# Patient Record
Sex: Female | Born: 2000 | Race: Black or African American | Hispanic: No | Marital: Single | Smoking: Never smoker
Health system: Southern US, Community
[De-identification: ages and names within clinical notes are randomized; demographics above are authoritative.]

## PROBLEM LIST (undated history)

## (undated) DIAGNOSIS — F333 Major depressive disorder, recurrent, severe with psychotic symptoms: Secondary | ICD-10-CM

## (undated) DIAGNOSIS — F419 Anxiety disorder, unspecified: Secondary | ICD-10-CM

## (undated) DIAGNOSIS — F321 Major depressive disorder, single episode, moderate: Secondary | ICD-10-CM

## (undated) DIAGNOSIS — F909 Attention-deficit hyperactivity disorder, unspecified type: Secondary | ICD-10-CM

## (undated) DIAGNOSIS — F913 Oppositional defiant disorder: Secondary | ICD-10-CM

## (undated) DIAGNOSIS — Z8489 Family history of other specified conditions: Secondary | ICD-10-CM

## (undated) DIAGNOSIS — Z8659 Personal history of other mental and behavioral disorders: Secondary | ICD-10-CM

## (undated) DIAGNOSIS — E669 Obesity, unspecified: Secondary | ICD-10-CM

## (undated) DIAGNOSIS — J189 Pneumonia, unspecified organism: Secondary | ICD-10-CM

## (undated) HISTORY — PX: TYMPANOSTOMY TUBE PLACEMENT: SHX32

---
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
## 2022-01-10 ENCOUNTER — Other Ambulatory Visit: Payer: Self-pay

## 2022-01-10 ENCOUNTER — Emergency Department (HOSPITAL_COMMUNITY)
Admission: EM | Admit: 2022-01-10 | Discharge: 2022-01-10 | Disposition: A | Discharge status: Home / Self Care | Payer: Medicaid Other | Attending: Emergency Medicine | Admitting: Emergency Medicine

## 2022-01-10 ENCOUNTER — Encounter (HOSPITAL_COMMUNITY): Payer: Self-pay

## 2022-01-10 DIAGNOSIS — M791 Myalgia, unspecified site: Secondary | ICD-10-CM | POA: Diagnosis present

## 2022-01-10 DIAGNOSIS — U071 COVID-19: Secondary | ICD-10-CM | POA: Diagnosis not present

## 2022-01-10 DIAGNOSIS — J069 Acute upper respiratory infection, unspecified: Secondary | ICD-10-CM | POA: Diagnosis not present

## 2022-01-10 DIAGNOSIS — B9789 Other viral agents as the cause of diseases classified elsewhere: Secondary | ICD-10-CM | POA: Diagnosis not present

## 2022-01-10 LAB — BASIC METABOLIC PANEL
Anion gap: 9 (ref 5–15)
BUN: <5 mg/dL — ABNORMAL LOW (ref 6–20)
CO2: 21 mmol/L — ABNORMAL LOW (ref 22–32)
Calcium: 9 mg/dL (ref 8.9–10.3)
Chloride: 108 mmol/L (ref 98–111)
Creatinine, Ser: 0.66 mg/dL (ref 0.44–1.00)
GFR, Estimated: >60 mL/min (ref >60)
Glucose, Bld: 103 mg/dL — ABNORMAL HIGH (ref 70–99)
Potassium: 3.2 mmol/L — ABNORMAL LOW (ref 3.5–5.1)
Sodium: 138 mmol/L (ref 135–145)

## 2022-01-10 LAB — CBC WITH DIFFERENTIAL/PLATELET
Abs Immature Granulocytes: 0.01 10*3/uL (ref 0.00–0.07)
Basophils Absolute: 0 10*3/uL (ref 0.0–0.1)
Basophils Relative: 1 %
Eosinophils Absolute: 0.1 10*3/uL (ref 0.0–0.5)
Eosinophils Relative: 2 %
HCT: 33.7 % — ABNORMAL LOW (ref 36.0–46.0)
Hemoglobin: 11.7 g/dL — ABNORMAL LOW (ref 12.0–15.0)
Immature Granulocytes: 0 %
Lymphocytes Relative: 28 %
Lymphs Abs: 1.1 10*3/uL (ref 0.7–4.0)
MCH: 28.2 pg (ref 26.0–34.0)
MCHC: 34.7 g/dL (ref 30.0–36.0)
MCV: 81.2 fL (ref 80.0–100.0)
Monocytes Absolute: 0.5 10*3/uL (ref 0.1–1.0)
Monocytes Relative: 12 %
Neutro Abs: 2.3 10*3/uL (ref 1.7–7.7)
Neutrophils Relative %: 57 %
Platelets: 308 10*3/uL (ref 150–400)
RBC: 4.15 MIL/uL (ref 3.87–5.11)
RDW: 13.7 % (ref 11.5–15.5)
WBC: 4 10*3/uL (ref 4.0–10.5)
nRBC: 0 % (ref 0.0–0.2)

## 2022-01-10 LAB — RESP PANEL BY RT-PCR (FLU A&B, COVID) ARPGX2
Influenza A by PCR: NEGATIVE
Influenza B by PCR: NEGATIVE
SARS Coronavirus 2 by RT PCR: POSITIVE — AB

## 2022-01-10 NOTE — Discharge Instructions (Addendum)
Please read and follow all provided instructions.  Your diagnoses today include:  1. Viral upper respiratory tract infection     You appear to have an upper respiratory infection (URI). An upper respiratory tract infection, or cold, is a viral infection of the air passages leading to the lungs. It should improve gradually after 5-7 days. You may have a lingering cough that lasts for 2- 4 weeks after the infection.  Tests performed today include: Vital signs. See below for your results today.  Complete blood cell count: Shows minimal anemia Basic metabolic panel: She had slightly low potassium COVID/flu testing: Sent and can be checked in mychart in a few hours  Medications prescribed:  None  Take any prescribed medications only as directed. Treatment for your infection is aimed at treating the symptoms. There are no medications, such as antibiotics, that will cure your infection.   Home care instructions:  You can take Tylenol and/or Ibuprofen as directed on the packaging for fever reduction and pain relief.    For cough: honey 1/2 to 1 teaspoon (you can dilute the honey in water or another fluid).  You can also use guaifenesin and dextromethorphan for cough. You can use a humidifier for chest congestion and cough.  If you don't have a humidifier, you can sit in the bathroom with the hot shower running.      For sore throat: try warm salt water gargles, cepacol lozenges, throat spray, warm tea or water with lemon/honey, popsicles or ice, or OTC cold relief medicine for throat discomfort.    For congestion: take a daily anti-histamine like Zyrtec, Claritin, and a oral decongestant, such as pseudoephedrine.  You can also use Flonase 1-2 sprays in each nostril daily.    It is important to stay hydrated: drink plenty of fluids (water, gatorade/powerade/pedialyte, juices, or teas) to keep your throat moisturized and help further relieve irritation/discomfort.   Your illness is contagious and  can be spread to others, especially during the first 3 or 4 days. It cannot be cured by antibiotics or other medicines. Take basic precautions such as washing your hands often, covering your mouth when you cough or sneeze, and avoiding public places where you could spread your illness to others.   Please continue drinking plenty of fluids.  Use over-the-counter medicines as needed as directed on packaging for symptom relief.  You may also use ibuprofen or tylenol as directed on packaging for pain or fever.  Do not take multiple medicines containing Tylenol or acetaminophen to avoid taking too much of this medication.  Follow-up instructions: Please follow-up with your primary care provider in the next 3 days for further evaluation of your symptoms if you are not feeling better.   Return instructions:  Please return to the Emergency Department if you experience worsening symptoms.  RETURN IMMEDIATELY IF you develop shortness of breath, confusion or altered mental status, a new rash, become dizzy, faint, or poorly responsive, or are unable to be cared for at home. Please return if you have persistent vomiting and cannot keep down fluids or develop a fever that is not controlled by tylenol or motrin.   Please return if you have any other emergent concerns.  Additional Information:  Your vital signs today were: BP (!) 118/59   Pulse 90   Temp 98.7 F (37.1 C) (Oral)   Resp 16   Ht '5\' 6"'$  (1.676 m)   Wt 108 kg   LMP 07/26/2021 (Approximate)   SpO2 100%   BMI  38.41 kg/m  If your blood pressure (BP) was elevated above 135/85 this visit, please have this repeated by your doctor within one month. --------------

## 2022-01-10 NOTE — ED Triage Notes (Signed)
Pt arrived POV from home c/o generalized body aches, nasal congestion and needing a covid and flu test for work.

## 2022-01-10 NOTE — ED Provider Triage Note (Signed)
Emergency Medicine Provider Triage Evaluation Note  Cassandra Simpson , a 21 y.o. female  was evaluated in triage.  Pt complains of generalized body aches, congestion, and overall feeling unwell over the last 1 to 2 days.  Denies neck stiffness or fevers.  States needs a COVID/flu test for work.  Review of Systems  Positive:  Negative: See above  Physical Exam  BP (!) 118/59   Pulse 90   Temp 98.7 F (37.1 C) (Oral)   Resp 16   Ht '5\' 6"'$  (1.676 m)   Wt 108 kg   LMP 07/26/2021 (Approximate)   SpO2 100%   BMI 38.41 kg/m  Gen:   Awake, no distress   Resp:  Normal effort  MSK:   Moves extremities without difficulty  Other:  Abdomen soft, nontender  Medical Decision Making  Medically screening exam initiated at 2:32 PM.  Appropriate orders placed.  Cassandra Simpson was informed that the remainder of the evaluation will be completed by another provider, this initial triage assessment does not replace that evaluation, and the importance of remaining in the ED until their evaluation is complete.     Prince Rome, PA-C 35/70/17 1452

## 2022-01-10 NOTE — ED Notes (Signed)
All care rendered by provider.

## 2022-01-10 NOTE — ED Provider Notes (Signed)
[place] EMERGENCY DEPARTMENT Provider Note   CSN: [id number] Arrival date & time: 01/10/22  1413     History  Chief Complaint  Patient presents with   Nasal Congestion   Generalized Body Aches    Cassandra Simpson is a 21 y.o. female.  Patient presents to the emergency department for evaluation of 2-day history of body aches, nasal congestion, fever to 101 F at home.  She denies cough or shortness of breath/trouble breathing.  States that where she is living requires a COVID test, and that is the main reason why she is here.  She is here with her small child who is already been seen and tested per her report.  She has not yet received results.  She has had nausea but no vomiting.  She reports being pregnant currently.  No history of diabetes or immunocompromise otherwise.  No known sick contacts other than her child who has also had URI symptoms.       Home Medications Prior to Admission medications   Medication Sig Start Date End Date Taking? Authorizing Provider  dicyclomine (BENTYL) 20 MG tablet Take 1 tablet (20 mg total) by mouth 3 (three) times daily before meals. 12/08/20   Orpah Greek, MD  pantoprazole (PROTONIX) 40 MG tablet Take 1 tablet (40 mg total) by mouth daily. 12/08/20   Orpah Greek, MD  sucralfate (CARAFATE) 1 GM/10ML suspension Take 10 mLs (1 g total) by mouth 4 (four) times daily -  with meals and at bedtime. 12/08/20   Orpah Greek, MD      Allergies    Patient has no known allergies.    Review of Systems   Review of Systems  Physical Exam Updated Vital Signs BP (!) 118/59   Pulse 90   Temp 98.7 F (37.1 C) (Oral)   Resp 16   Ht '5\' 6"'$  (1.676 m)   Wt 108 kg   LMP 07/26/2021 (Approximate)   SpO2 100%   BMI 38.41 kg/m   Physical Exam Vitals and nursing note reviewed.  Constitutional:      Appearance: She is well-developed.  HENT:     Head: Normocephalic and atraumatic.     Jaw: No trismus.      Right Ear: Tympanic membrane, ear canal and external ear normal.     Left Ear: Tympanic membrane, ear canal and external ear normal.     Nose: Congestion present. No mucosal edema or rhinorrhea.     Mouth/Throat:     Mouth: Mucous membranes are moist. Mucous membranes are not dry. No oral lesions.     Pharynx: Uvula midline. No oropharyngeal exudate, posterior oropharyngeal erythema or uvula swelling.     Tonsils: No tonsillar abscesses.  Eyes:     General:        Right eye: No discharge.        Left eye: No discharge.     Conjunctiva/sclera: Conjunctivae normal.  Cardiovascular:     Rate and Rhythm: Normal rate and regular rhythm.     Heart sounds: Normal heart sounds.  Pulmonary:     Effort: Pulmonary effort is normal. No respiratory distress.     Breath sounds: Normal breath sounds. No wheezing or rales.  Abdominal:     Palpations: Abdomen is soft.     Tenderness: There is no abdominal tenderness.  Musculoskeletal:     Cervical back: Normal range of motion and neck supple.  Lymphadenopathy:     Cervical: No cervical adenopathy.  Skin:    General: Skin is warm and dry.  Neurological:     Mental Status: She is alert.  Psychiatric:        Mood and Affect: Mood normal.     ED Results / Procedures / Treatments   Labs (all labs ordered are listed, but only abnormal results are displayed) Labs Reviewed  BASIC METABOLIC PANEL - Abnormal; Notable for the following components:      Result Value   Potassium 3.2 (*)    CO2 21 (*)    Glucose, Bld 103 (*)    BUN <5 (*)    All other components within normal limits  CBC WITH DIFFERENTIAL/PLATELET - Abnormal; Notable for the following components:   Hemoglobin 11.7 (*)    HCT 33.7 (*)    All other components within normal limits  RESP PANEL BY RT-PCR (FLU A&B, COVID) [id number]    EKG None  Radiology No results found.  Procedures Procedures    Medications Ordered in ED Medications - No data to display  ED Course/ Medical  Decision Making/ A&P    Patient seen and examined. History obtained directly from patient. Work-up including labs, imaging, EKG ordered in triage, if performed, were reviewed.    Labs/EKG: Independently reviewed and interpreted.  This included: CBC with minimal anemia, likely explained by pregnancy; BMP with mildly low potassium at 3.2, normal kidney function, glucose 103. COVID testing and flu testing ordered, pending swab being drawn.  Imaging: None  Medications/Fluids: None ordered  Most recent vital signs reviewed and are as follows: BP (!) 118/59   Pulse 90   Temp 98.7 F (37.1 C) (Oral)   Resp 16   Ht '5\' 6"'$  (1.676 m)   Wt 108 kg   LMP 07/26/2021 (Approximate)   SpO2 100%   BMI 38.41 kg/m   Initial impression: Upper respiratory infection  Plan: D/c home pending COVID results.  Patient will follow up on her own results in [place].  Home treatment plan: Conservative symptom management, discussed isolation for 5 days if COVID +.   Return instructions discussed with patient: Worsening symptoms, shortness of breath, persistent vomiting  Follow-up instructions discussed with patient: Follow-up with PCP as needed for symptoms if not improving                            Medical Decision Making  Patient with symptoms consistent with a viral syndrome. Vitals are stable, no fever. No signs of dehydration. Lung exam normal, no signs of pneumonia and low concern for this or sepsis. Supportive therapy indicated with return if symptoms worsen.    The patient's vital signs, pertinent lab work and imaging were reviewed and interpreted as discussed in the ED course. Hospitalization was considered for further testing, treatments, or serial exams/observation. However as patient is well-appearing, has a stable exam, and reassuring studies today, I do not feel that they warrant admission at this time. This plan was discussed with the patient who verbalizes agreement and comfort with this  plan and seems reliable and able to return to the Emergency Department with worsening or changing symptoms.           Final Clinical Impression(s) / ED Diagnoses Final diagnoses:  Viral upper respiratory tract infection    Rx / DC Orders ED Discharge Orders     None         Carlisle Cater, PA-C 01/10/22 1636    Gareth Morgan, MD 01/13/22  0145  

## 2022-01-22 DIAGNOSIS — Z3689 Encounter for other specified antenatal screening: Secondary | ICD-10-CM | POA: Diagnosis not present

## 2022-01-22 DIAGNOSIS — Z3A24 24 weeks gestation of pregnancy: Secondary | ICD-10-CM | POA: Diagnosis not present

## 2022-01-22 DIAGNOSIS — Z362 Encounter for other antenatal screening follow-up: Secondary | ICD-10-CM | POA: Diagnosis not present

## 2022-02-06 DIAGNOSIS — Z3689 Encounter for other specified antenatal screening: Secondary | ICD-10-CM | POA: Diagnosis not present

## 2022-03-02 ENCOUNTER — Inpatient Hospital Stay (HOSPITAL_COMMUNITY)
Admission: AD | Admit: 2022-03-02 | Discharge: 2022-03-03 | Disposition: A | Discharge status: Home / Self Care | Payer: Medicaid Other | Attending: Obstetrics & Gynecology | Admitting: Obstetrics & Gynecology

## 2022-03-02 ENCOUNTER — Encounter (HOSPITAL_COMMUNITY): Payer: Self-pay | Admitting: Obstetrics & Gynecology

## 2022-03-02 DIAGNOSIS — Z3689 Encounter for other specified antenatal screening: Secondary | ICD-10-CM

## 2022-03-02 DIAGNOSIS — O321XX0 Maternal care for breech presentation, not applicable or unspecified: Secondary | ICD-10-CM | POA: Insufficient documentation

## 2022-03-02 DIAGNOSIS — R000 Tachycardia, unspecified: Secondary | ICD-10-CM | POA: Diagnosis not present

## 2022-03-02 DIAGNOSIS — A599 Trichomoniasis, unspecified: Secondary | ICD-10-CM

## 2022-03-02 DIAGNOSIS — Z3A30 30 weeks gestation of pregnancy: Secondary | ICD-10-CM

## 2022-03-02 DIAGNOSIS — O4693 Antepartum hemorrhage, unspecified, third trimester: Secondary | ICD-10-CM | POA: Insufficient documentation

## 2022-03-02 DIAGNOSIS — R58 Hemorrhage, not elsewhere classified: Secondary | ICD-10-CM | POA: Diagnosis not present

## 2022-03-02 DIAGNOSIS — R1111 Vomiting without nausea: Secondary | ICD-10-CM | POA: Diagnosis not present

## 2022-03-02 DIAGNOSIS — O99213 Obesity complicating pregnancy, third trimester: Secondary | ICD-10-CM | POA: Insufficient documentation

## 2022-03-02 DIAGNOSIS — R110 Nausea: Secondary | ICD-10-CM | POA: Diagnosis not present

## 2022-03-02 DIAGNOSIS — R112 Nausea with vomiting, unspecified: Secondary | ICD-10-CM | POA: Diagnosis not present

## 2022-03-02 NOTE — MAU Note (Signed)
Cassandra Simpson is a 21 y.o. at 23w2dhere in MAU reporting: by EMS reporting bleeding that began tonight. Pt states that she felt wet and went to the bathroom and saw blood in the toilet and on the toilet paper after wiping. Pt denies seeing clots. Pt reports lower abdominal cramping. Pt reports decreased fetal movement. Pt denies LOF. Pt states she has white thin discharge.   Onset of complaint: 03/02/2022 Pain score: 5/10 Vitals:   03/02/22 2359  BP: 127/66  Pulse: 86  Resp: (!) 24  Temp: 98 F (36.7 C)  SpO2: 100%     FHT:135 Lab orders placed from triage:

## 2022-03-02 NOTE — MAU Note (Incomplete)
Cassandra Simpson is a 21 y.o. at 31w2dhere in MAU reporting: bleeding that began tonight. Pt states that she felt wet and went to the bathroom and saw blood in the toilet and on the toilet paper after wiping. Pt denies seeing clots. Pt reports lower abdominal cramping. Pt reports decreased fetal movement. Pt denies LOF. Pt states she has white thin discharge.   Onset of complaint: 03/02/2022 Pain score: 5/10 There were no vitals filed for this visit.   FHT:*** Lab orders placed from triage:

## 2022-03-03 ENCOUNTER — Inpatient Hospital Stay (HOSPITAL_BASED_OUTPATIENT_CLINIC_OR_DEPARTMENT_OTHER): Payer: Medicaid Other

## 2022-03-03 DIAGNOSIS — O4693 Antepartum hemorrhage, unspecified, third trimester: Secondary | ICD-10-CM

## 2022-03-03 DIAGNOSIS — O321XX0 Maternal care for breech presentation, not applicable or unspecified: Secondary | ICD-10-CM | POA: Diagnosis not present

## 2022-03-03 DIAGNOSIS — Z3A30 30 weeks gestation of pregnancy: Secondary | ICD-10-CM | POA: Diagnosis not present

## 2022-03-03 DIAGNOSIS — O99213 Obesity complicating pregnancy, third trimester: Secondary | ICD-10-CM | POA: Diagnosis not present

## 2022-03-03 DIAGNOSIS — E669 Obesity, unspecified: Secondary | ICD-10-CM

## 2022-03-03 LAB — WET PREP, GENITAL
Clue Cells Wet Prep HPF POC: NONE SEEN
Sperm: NONE SEEN
WBC, Wet Prep HPF POC: >=10 — AB (ref <10)
Yeast Wet Prep HPF POC: NONE SEEN

## 2022-03-03 MED ORDER — METRONIDAZOLE 500 MG PO TABS
2000.0000 mg | ORAL_TABLET | Freq: Once | ORAL | Status: AC
  Administered 2022-03-03: 2000 mg via ORAL
  Filled 2022-03-03: qty 4

## 2022-03-03 MED ORDER — ACETAMINOPHEN 500 MG PO TABS
1000.0000 mg | ORAL_TABLET | Freq: Once | ORAL | Status: AC
  Administered 2022-03-03: 1000 mg via ORAL
  Filled 2022-03-03: qty 2

## 2022-03-03 NOTE — MAU Provider Note (Signed)
History     CSN: [id number]  Arrival date and time: 03/02/22 2344   Event Date/Time   First Provider Initiated Contact with Patient 03/03/22 0006      Chief Complaint  Patient presents with   Vaginal Bleeding   Cassandra Simpson is a 21 y.o. G2P1001 at [facility]0d[place] [facility]  She reports her last appt was Oct 9th and her next one is scheduled for Tuesday. She presents today for Vaginal Bleeding.  She states she felt wetness" around 10 or 11pm and went to the bathroom were she noted blood with wiping.  She states she also noted blood in the toilet.  She reports it was dark red and denies passing of clots. She endorses fetal movement and abdominal cramping that is located in the lower abdomen.  She states the pain is intermittent and lasts "a couple minutes."  She reports improvement with laying down and worsening with movement.  She rates the pain a 10/10 at it's worse, but currently 5-6/10. She denies recent sexual activity or issues with urination. She endorses history of CT during the pregnancy.     OB History     Gravida  2   Para  1   Term  1   Preterm      AB      Living  1      SAB      IAB      Ectopic      Multiple  0   Live Births  1           Past Medical History:  Diagnosis Date   ADHD    Anxiety    Seasonal   Family history of adverse reaction to anesthesia    Mother- woke during ECRP- briefy   History of ADHD 07/06/2016   MD[place] (major depressive disorder), [place], [place]   [place] [place]   Obesity    Oppositional defiant disorder    Pneumonia    2017    Past Surgical History:  Procedure Laterality Date   ESOPHAGOGASTRODUODENOSCOPY N/A 06/19/2016   Procedure: ESOPHAGOGASTRODUODENOSCOPY (EGD);  Surgeon: QJoycelyn Rua MD;  [facility]  Service: Gastroenterology;  Laterality: N/A;   TYMPANOSTOMY TUBE PLACEMENT      Family  History  Problem Relation Age of Onset   Asthma Brother    Arthritis Maternal Grandmother    Depression Maternal Grandmother    Hyperlipidemia Maternal Grandmother    Stroke Maternal Grandmother    Hearing loss Sister     Social History   Tobacco Use   Smoking status: Never   Smokeless tobacco: Never  Vaping Use   Vaping Use: Never used  Substance Use Topics   Alcohol use: No   Drug use: No    Comment: UDS postive for THC    Allergies: No Known Allergies  Medications Prior to Admission  Medication Sig Dispense Refill Last Dose   Prenatal Vit-Fe Fumarate-FA (PRENATAL MULTIVITAMIN) TABS tablet Take 1 tablet by mouth daily at 12 noon.   03/01/2022   promethazine (PHENERGAN) 12.5 MG tablet Take 12.5 mg by mouth every 6 (six) hours as needed for nausea or vomiting.   03/01/2022 at 0730   dicyclomine (BENTYL) 20 MG tablet Take 1 tablet (20 mg total) by mouth 3 (three) times daily before meals. 30 tablet 0    pantoprazole (PROTONIX) 40 MG tablet Take 1 tablet (40 mg total) by mouth  daily. 30 tablet 3    sucralfate (CARAFATE) 1 GM/10ML suspension Take 10 mLs (1 g total) by mouth 4 (four) times daily -  with meals and at bedtime. 420 mL 0     Review of Systems  Constitutional:  Negative for chills and fever.  Gastrointestinal:  Positive for abdominal pain (Cramping), nausea and vomiting. Negative for constipation and diarrhea.  Genitourinary:  Positive for vaginal bleeding and vaginal discharge ("White regular"). Negative for difficulty urinating and dysuria.  Neurological:  Negative for dizziness, light-headedness and headaches.   Physical Exam   Blood pressure 127/66, pulse 86, temperature 98 F (36.7 C), temperature source Oral, resp. rate (!) 24, last menstrual period 08/05/2021, SpO2 100 %, unknown if currently breastfeeding.  Physical Exam Vitals reviewed. Exam conducted with a chaperone present.  Constitutional:      Appearance: Normal appearance.  HENT:     Head:  Normocephalic and atraumatic.  Eyes:     Conjunctiva/sclera: Conjunctivae normal.  Cardiovascular:     Rate and Rhythm: Normal rate.  Pulmonary:     Effort: Pulmonary effort is normal. No respiratory distress.  Genitourinary:    General: Normal vulva.     Comments: Small amt blood noted on external genitalia. No blood or discharge at introitus.  No blood and scant amt white discharge noted in vault.  Wet prep collected. Cervix appears closed. No active bleeding.   Musculoskeletal:     Cervical back: Normal range of motion.  Skin:    General: Skin is warm and dry.  Neurological:     Mental Status: She is alert and oriented to person, place, and time.  Psychiatric:        Mood and Affect: Mood normal.        Behavior: Behavior normal.     Fetal Assessment 130 bpm, Mod Var, -Decels, +Accels Toco: One ctx graphed  MAU Course   Results for orders placed or performed during the hospital encounter of 03/02/22 (from the past 24 hour(s))  Wet prep, genital     Status: Abnormal   Collection Time: 03/03/22 12:20 AM   Specimen: PATH Cytology Cervicovaginal Ancillary Only  Result Value Ref Range   Yeast Wet Prep HPF POC NONE SEEN NONE SEEN   Trich, Wet Prep PRESENT (A) NONE SEEN   Clue Cells Wet Prep HPF POC NONE SEEN NONE SEEN   WBC, Wet Prep HPF POC >=10 (A) <10   Sperm NONE SEEN             MDM PE Labs: Wet prep EFM Ultrasound Analgesic Antibiotic Assessment and Plan  21 year old G2P1001  SIUP at 30 weeks Cat I FT Vaginal Bleeding   -Exam performed and findings discussed. -Cultures collected and pending. -Reassured that no blood noted in vault or from cervix. -Patient offered and accepts pain medication. Tylenol ordered.  -Will send for [place] and await results.    [name] MSN, CNM 03/03/2022, 12:06 AM   Reassessment (1:29 AM) Trichomoniasis   -Results return as above. -Provider to bedside to discuss. -Patient reports she is aware of trich and  treatment. Declines partner treatment. -Flagyl 2g given now. -Precautions reviewed. -Encouraged to call primary office or return to MAU if symptoms worsen or with the onset of new symptoms. -Discharged to home in stable condition.  [name] MSN, CNM Advanced Practice Provider, [facility]

## 2022-03-05 LAB — GC/CHLAMYDIA PROBE AMP (~~LOC~~) NOT AT ARMC
Chlamydia: NEGATIVE
Comment: NEGATIVE
Comment: NORMAL
Neisseria Gonorrhea: NEGATIVE

## 2022-03-06 DIAGNOSIS — Z363 Encounter for antenatal screening for malformations: Secondary | ICD-10-CM | POA: Diagnosis not present

## 2022-04-17 DIAGNOSIS — O403XX0 Polyhydramnios, third trimester, not applicable or unspecified: Secondary | ICD-10-CM | POA: Diagnosis not present

## 2022-04-17 DIAGNOSIS — O365930 Maternal care for other known or suspected poor fetal growth, third trimester, not applicable or unspecified: Secondary | ICD-10-CM | POA: Diagnosis not present

## 2022-04-17 DIAGNOSIS — U071 COVID-19: Secondary | ICD-10-CM | POA: Diagnosis not present

## 2022-04-17 DIAGNOSIS — Z3A36 36 weeks gestation of pregnancy: Secondary | ICD-10-CM | POA: Diagnosis not present

## 2022-04-17 DIAGNOSIS — O99213 Obesity complicating pregnancy, third trimester: Secondary | ICD-10-CM | POA: Diagnosis not present

## 2022-04-17 DIAGNOSIS — O99323 Drug use complicating pregnancy, third trimester: Secondary | ICD-10-CM | POA: Diagnosis not present

## 2022-04-17 DIAGNOSIS — F1290 Cannabis use, unspecified, uncomplicated: Secondary | ICD-10-CM | POA: Diagnosis not present

## 2022-04-17 DIAGNOSIS — Z3689 Encounter for other specified antenatal screening: Secondary | ICD-10-CM | POA: Diagnosis not present

## 2022-04-17 DIAGNOSIS — O98513 Other viral diseases complicating pregnancy, third trimester: Secondary | ICD-10-CM | POA: Diagnosis not present

## 2022-04-17 DIAGNOSIS — O0933 Supervision of pregnancy with insufficient antenatal care, third trimester: Secondary | ICD-10-CM | POA: Diagnosis not present

## 2022-04-24 DIAGNOSIS — O365930 Maternal care for other known or suspected poor fetal growth, third trimester, not applicable or unspecified: Secondary | ICD-10-CM | POA: Diagnosis not present

## 2022-04-24 DIAGNOSIS — O98513 Other viral diseases complicating pregnancy, third trimester: Secondary | ICD-10-CM | POA: Diagnosis not present

## 2022-04-24 DIAGNOSIS — O0933 Supervision of pregnancy with insufficient antenatal care, third trimester: Secondary | ICD-10-CM | POA: Diagnosis not present

## 2022-04-24 DIAGNOSIS — Z3A37 37 weeks gestation of pregnancy: Secondary | ICD-10-CM | POA: Diagnosis not present

## 2022-04-24 DIAGNOSIS — U071 COVID-19: Secondary | ICD-10-CM | POA: Diagnosis not present

## 2022-04-30 IMAGING — US US ABDOMEN LIMITED
1 series · 15 of 25 positions shown · non-contrast
Comparison: CT Abdomen and Pelvis 03/17/2015.

CLINICAL DATA: 20-year-old female with upper abdominal pain.

EXAM:
ULTRASOUND ABDOMEN LIMITED RIGHT UPPER QUADRANT

[Series 1: us abdomen limited ruq mc & wl · 15 of 43 slices shown]
[im 1/43]
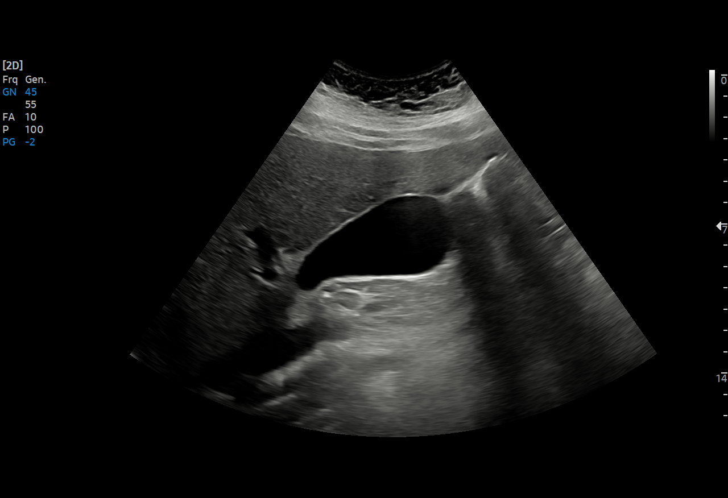
[im 4/43]
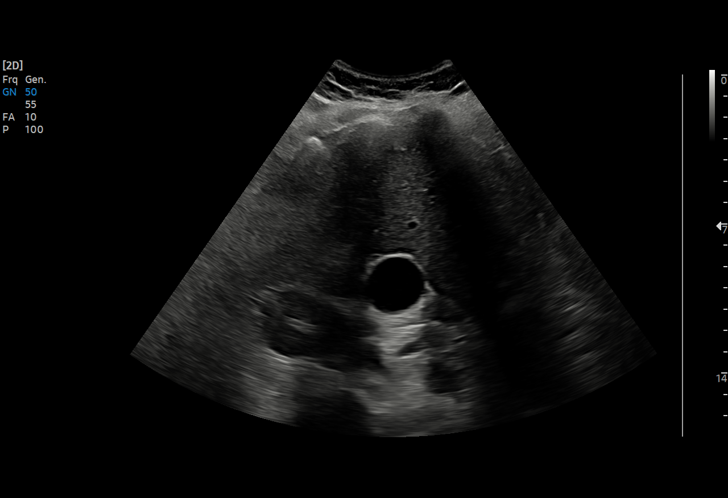
[im 8/43]
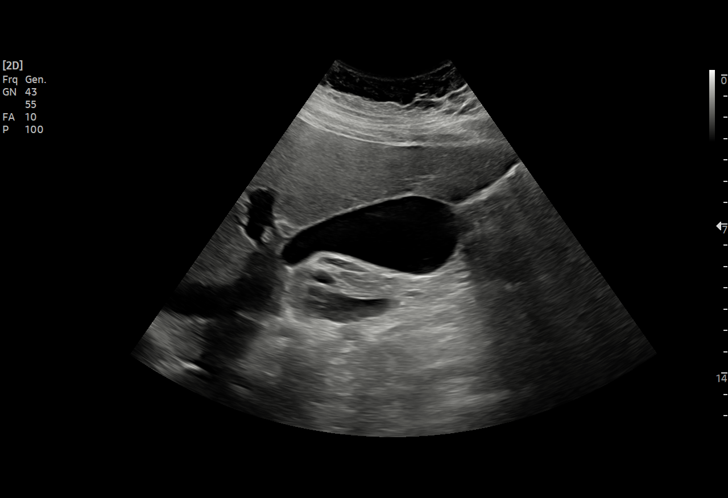
[im 9/43]
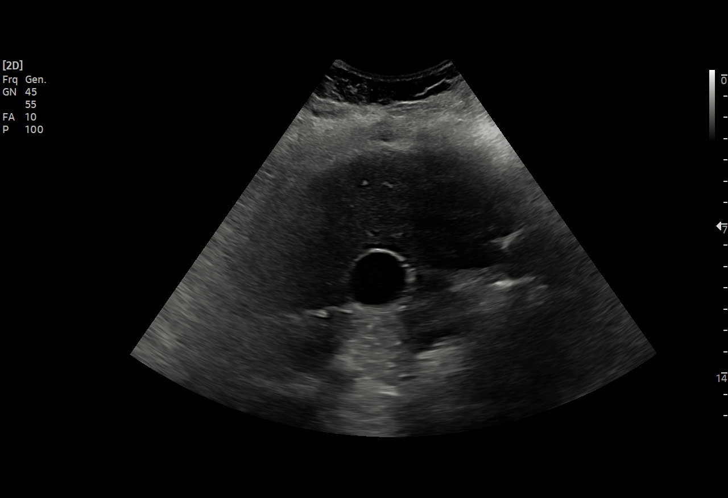
[im 13/43]
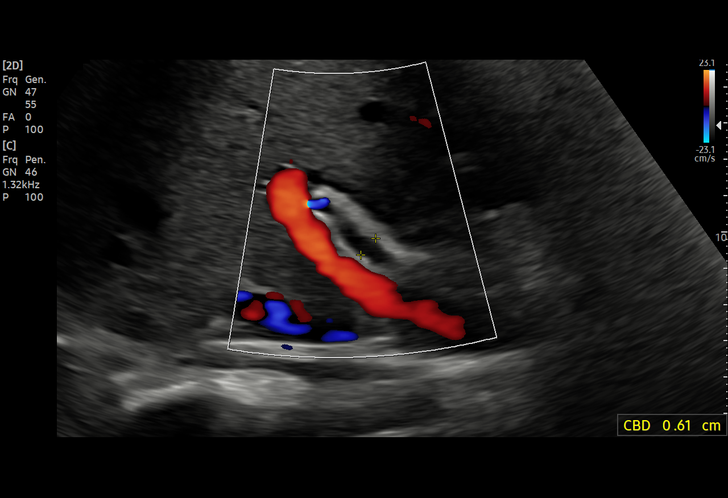
[im 16/43]
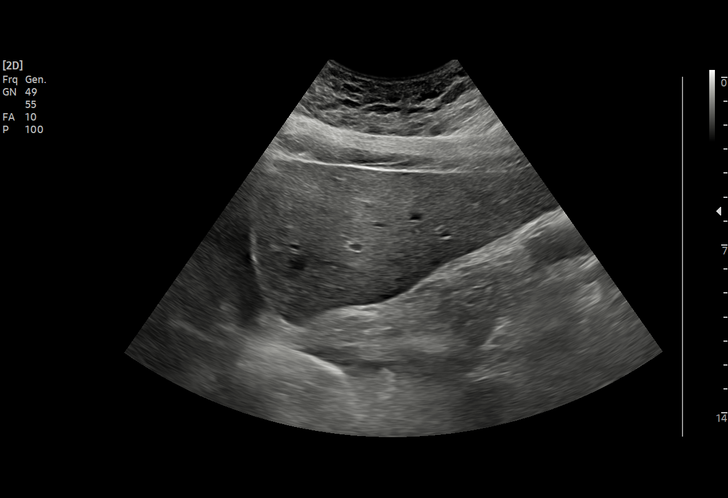
[im 18/43]
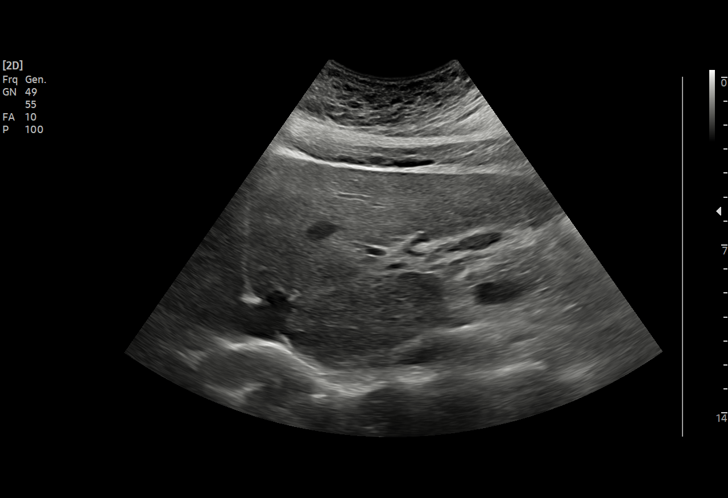
[im 22/43]
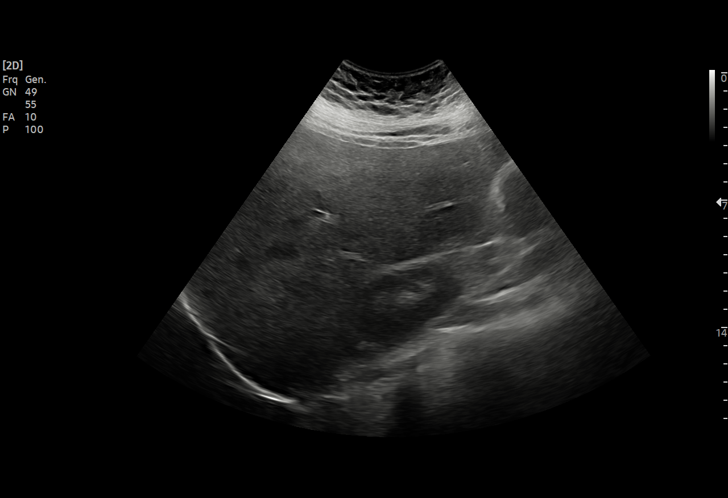
[im 25/43]
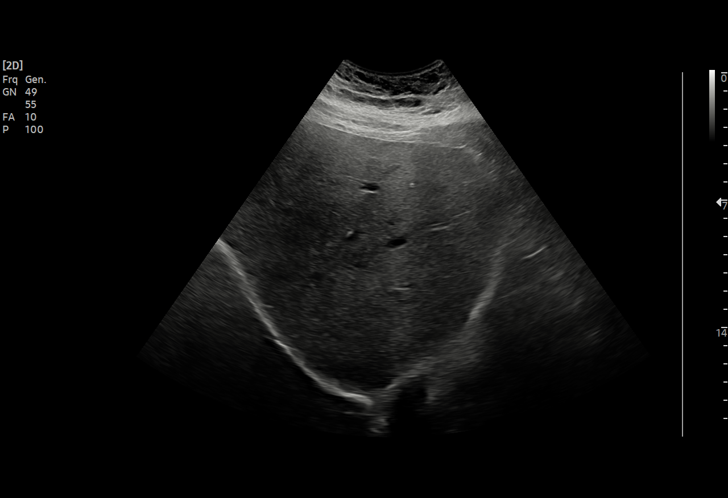
[im 27/43]
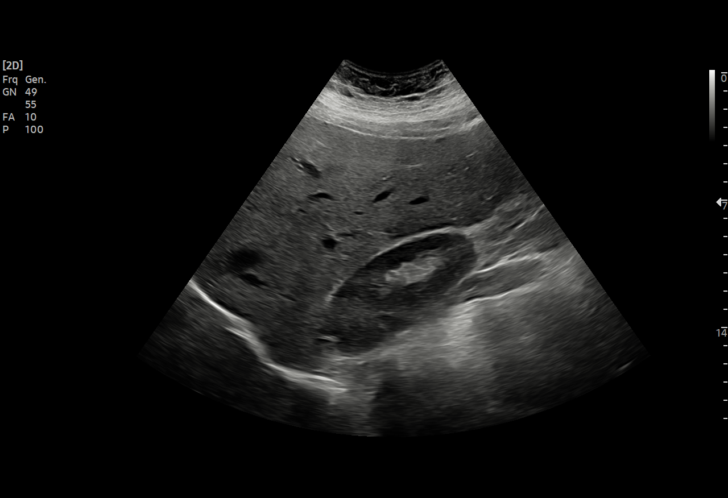
[im 30/43]
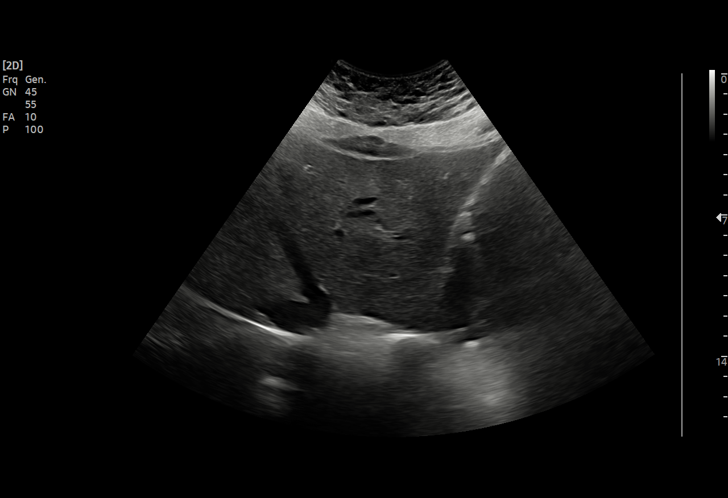
[im 34/43]
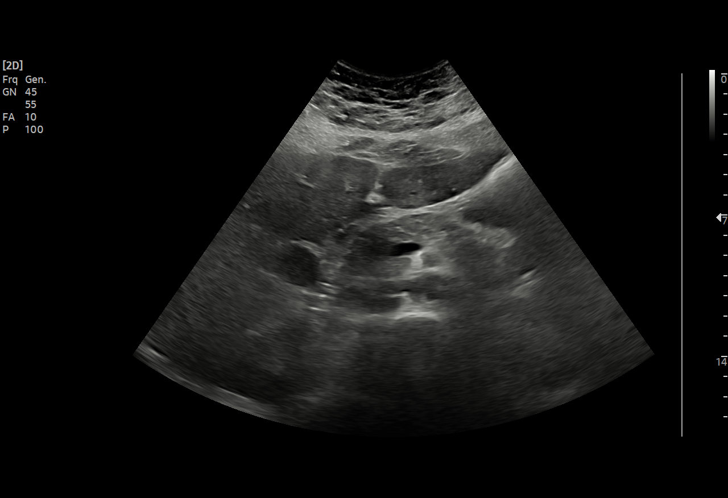
[im 36/43]
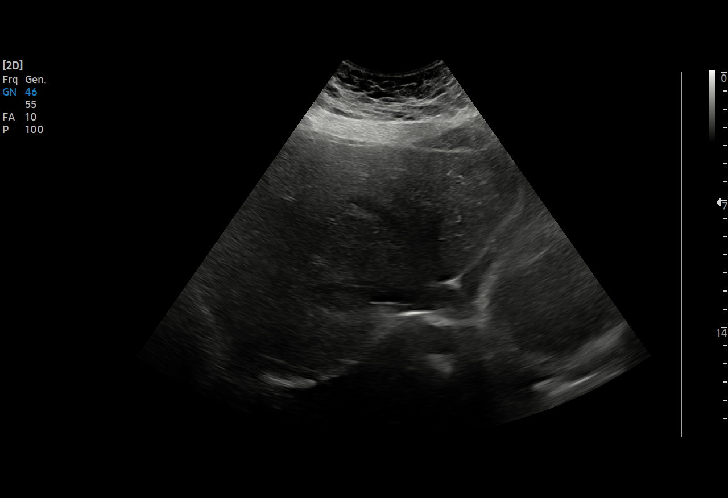
[im 39/43]
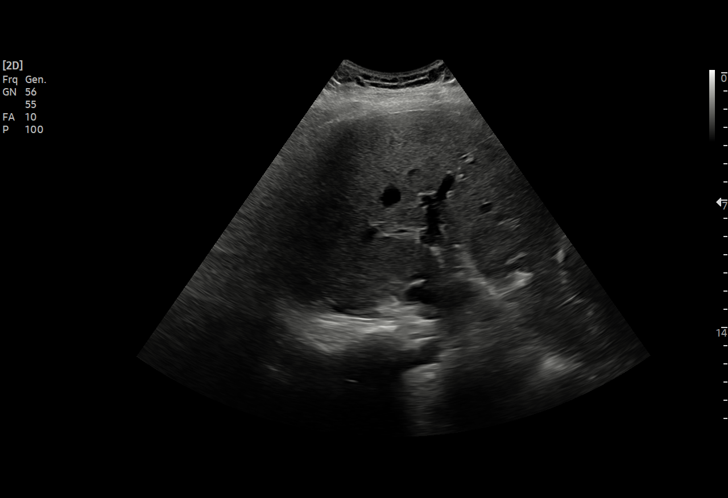
[im 43/43]
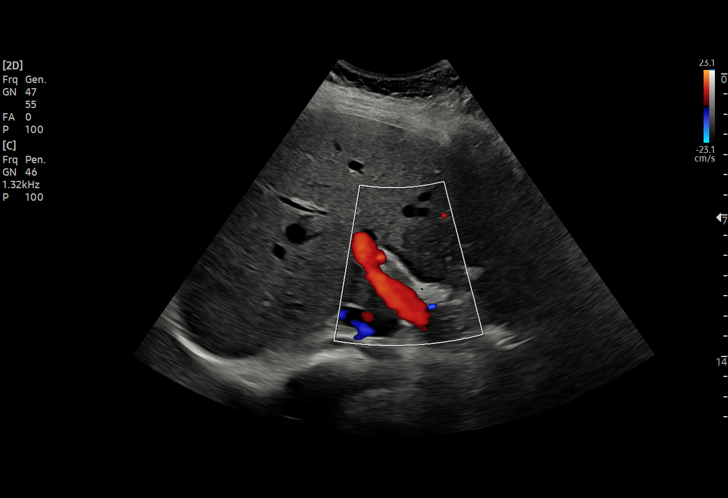

[15 of 25 positions shown; findings below may reference images not displayed]

FINDINGS: Gallbladder:

No gallstones or wall thickening visualized. No sonographic Murphy
sign noted by sonographer.

Common bile duct:

Diameter: 6 mm, upper limits of normal.

Liver:

Liver echogenicity within normal limits (image 27). No discrete
liver lesion. No intrahepatic biliary ductal dilatation. Portal vein
is patent on color Doppler imaging with normal direction of blood
flow towards the liver.

Other: Negative visible right kidney.
IMPRESSION: Normal right upper quadrant ultrasound.

## 2022-05-02 DIAGNOSIS — O99213 Obesity complicating pregnancy, third trimester: Secondary | ICD-10-CM | POA: Diagnosis not present

## 2022-05-02 DIAGNOSIS — Z3A38 38 weeks gestation of pregnancy: Secondary | ICD-10-CM | POA: Diagnosis not present

## 2022-05-02 DIAGNOSIS — O365930 Maternal care for other known or suspected poor fetal growth, third trimester, not applicable or unspecified: Secondary | ICD-10-CM | POA: Diagnosis not present

## 2022-05-09 DIAGNOSIS — O133 Gestational [pregnancy-induced] hypertension without significant proteinuria, third trimester: Secondary | ICD-10-CM | POA: Diagnosis not present

## 2022-05-09 DIAGNOSIS — Z8616 Personal history of COVID-19: Secondary | ICD-10-CM | POA: Diagnosis not present

## 2022-05-09 DIAGNOSIS — O365930 Maternal care for other known or suspected poor fetal growth, third trimester, not applicable or unspecified: Secondary | ICD-10-CM | POA: Diagnosis not present

## 2022-05-09 DIAGNOSIS — O403XX0 Polyhydramnios, third trimester, not applicable or unspecified: Secondary | ICD-10-CM | POA: Diagnosis not present

## 2022-05-09 DIAGNOSIS — O134 Gestational [pregnancy-induced] hypertension without significant proteinuria, complicating childbirth: Secondary | ICD-10-CM | POA: Diagnosis not present

## 2022-05-09 DIAGNOSIS — Z3A39 39 weeks gestation of pregnancy: Secondary | ICD-10-CM | POA: Diagnosis not present

## 2022-05-09 DIAGNOSIS — O99213 Obesity complicating pregnancy, third trimester: Secondary | ICD-10-CM | POA: Diagnosis not present

## 2022-05-10 DIAGNOSIS — Z3A39 39 weeks gestation of pregnancy: Secondary | ICD-10-CM | POA: Diagnosis not present

## 2022-06-19 DIAGNOSIS — Z202 Contact with and (suspected) exposure to infections with a predominantly sexual mode of transmission: Secondary | ICD-10-CM | POA: Diagnosis not present

## 2022-06-19 DIAGNOSIS — Z113 Encounter for screening for infections with a predominantly sexual mode of transmission: Secondary | ICD-10-CM | POA: Diagnosis not present

## 2022-06-19 DIAGNOSIS — Z114 Encounter for screening for human immunodeficiency virus [HIV]: Secondary | ICD-10-CM | POA: Diagnosis not present

## 2022-07-03 DIAGNOSIS — R064 Hyperventilation: Secondary | ICD-10-CM | POA: Diagnosis not present

## 2022-07-03 DIAGNOSIS — M79631 Pain in right forearm: Secondary | ICD-10-CM | POA: Diagnosis not present

## 2022-07-03 DIAGNOSIS — R000 Tachycardia, unspecified: Secondary | ICD-10-CM | POA: Diagnosis not present

## 2022-07-03 DIAGNOSIS — R079 Chest pain, unspecified: Secondary | ICD-10-CM | POA: Diagnosis not present

## 2022-07-03 DIAGNOSIS — R55 Syncope and collapse: Secondary | ICD-10-CM | POA: Diagnosis not present

## 2022-07-03 DIAGNOSIS — I10 Essential (primary) hypertension: Secondary | ICD-10-CM | POA: Diagnosis not present

## 2022-07-03 DIAGNOSIS — Z3202 Encounter for pregnancy test, result negative: Secondary | ICD-10-CM | POA: Diagnosis not present

## 2022-07-03 DIAGNOSIS — S0993XA Unspecified injury of face, initial encounter: Secondary | ICD-10-CM | POA: Diagnosis not present

## 2022-07-03 DIAGNOSIS — R220 Localized swelling, mass and lump, head: Secondary | ICD-10-CM | POA: Diagnosis not present

## 2022-07-03 DIAGNOSIS — R0789 Other chest pain: Secondary | ICD-10-CM | POA: Diagnosis not present

## 2022-07-03 DIAGNOSIS — Y999 Unspecified external cause status: Secondary | ICD-10-CM | POA: Diagnosis not present

## 2023-03-22 DIAGNOSIS — N912 Amenorrhea, unspecified: Secondary | ICD-10-CM | POA: Diagnosis not present

## 2023-12-31 DIAGNOSIS — B349 Viral infection, unspecified: Secondary | ICD-10-CM | POA: Diagnosis not present

## 2024-01-09 DIAGNOSIS — F1290 Cannabis use, unspecified, uncomplicated: Secondary | ICD-10-CM | POA: Diagnosis not present

## 2024-01-09 DIAGNOSIS — D573 Sickle-cell trait: Secondary | ICD-10-CM | POA: Diagnosis not present

## 2024-01-09 DIAGNOSIS — R0789 Other chest pain: Secondary | ICD-10-CM | POA: Diagnosis not present
